# Patient Record
Sex: Female | Born: 1990 | Race: Black or African American | Hispanic: No | Marital: Single | Smoking: Never smoker
Health system: Southern US, Community
[De-identification: ages and names within clinical notes are randomized; demographics above are authoritative.]

---
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
## 2010-01-04 ENCOUNTER — Inpatient Hospital Stay (HOSPITAL_COMMUNITY): Admission: AD | Admit: 2010-01-04 | Discharge: 2010-01-04 | Payer: Self-pay | Admitting: Obstetrics and Gynecology

## 2010-01-08 ENCOUNTER — Inpatient Hospital Stay (HOSPITAL_COMMUNITY): Admission: AD | Admit: 2010-01-08 | Discharge: 2010-01-11 | Payer: Self-pay | Admitting: Obstetrics and Gynecology

## 2010-11-24 IMAGING — US US OB DETAIL+14 WK
1 series · 14 of 28 positions shown · non-contrast
Comparison: none

OBSTETRICAL ULTRASOUND:
 This ultrasound exam was performed in the [HOSPITAL] Ultrasound Department.  The OB US report was generated in the AS system, and faxed to the ordering physician.  This report is also available in [HOSPITAL]?s Access[employer] and in [REDACTED] PACS.

[Series 1: us ob detail +14 wk · 0.24mm/px · 14 of 77 slices shown]
[im 3/77]
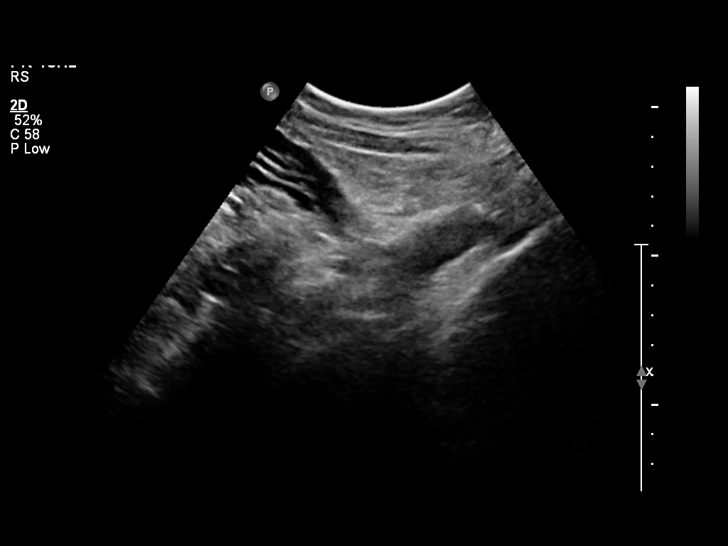
[im 9/77]
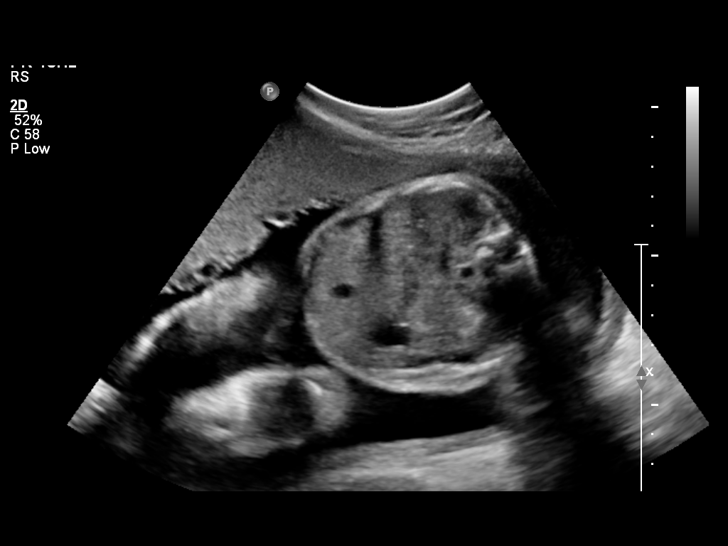
[im 15/77]
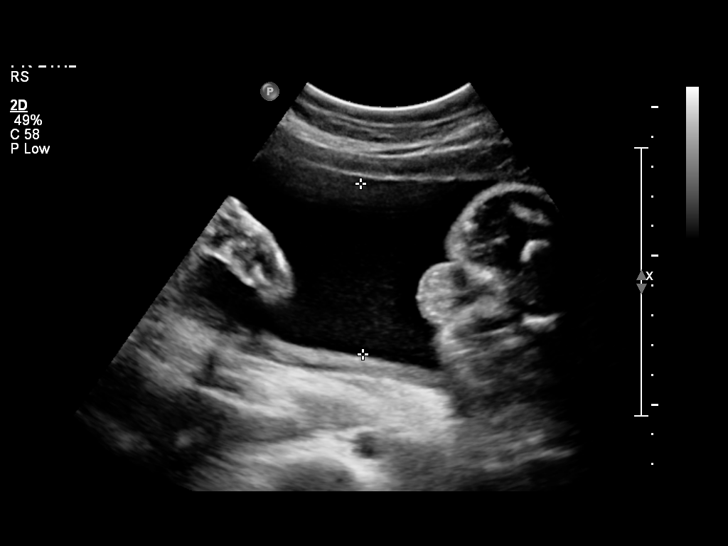
[im 20/77]
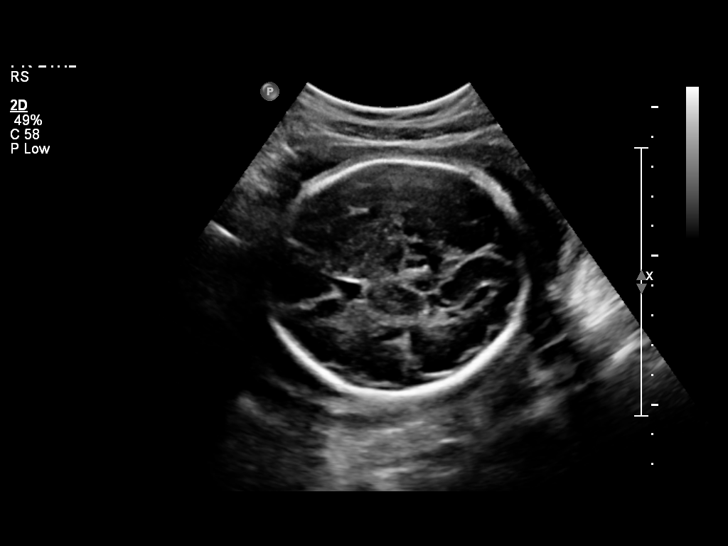
[im 26/77]
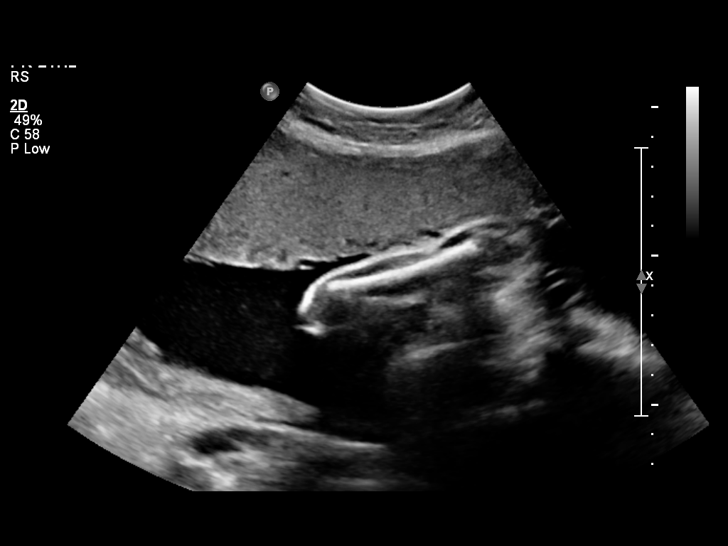
[im 31/77]
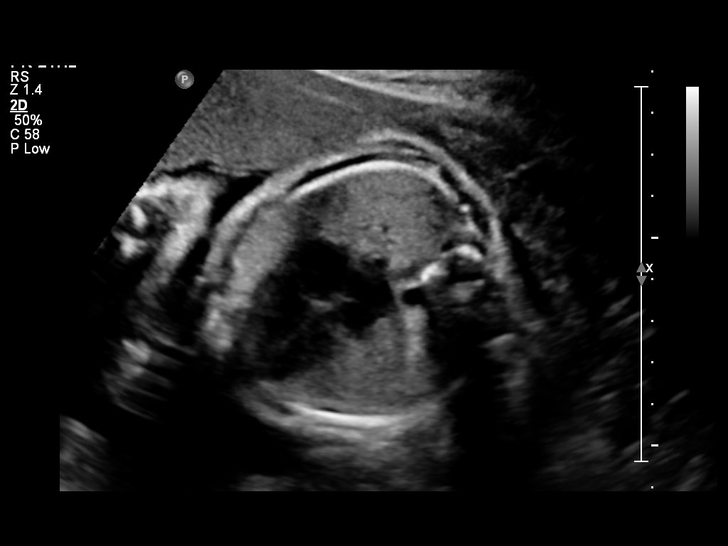
[im 37/77]
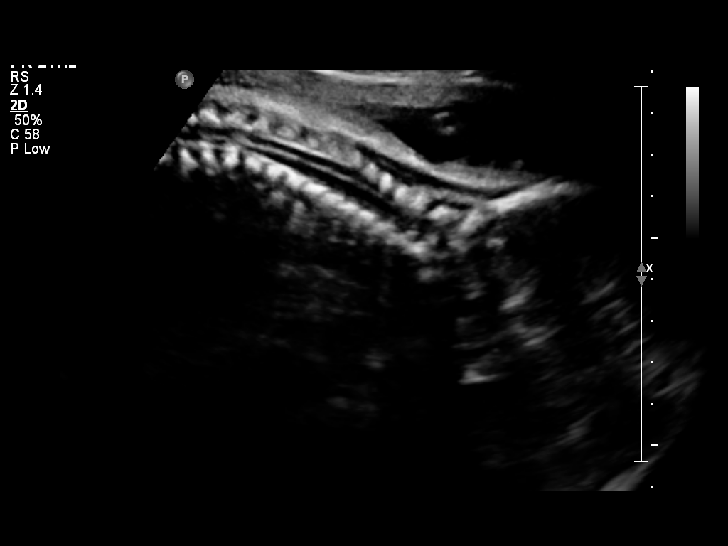
[im 43/77]
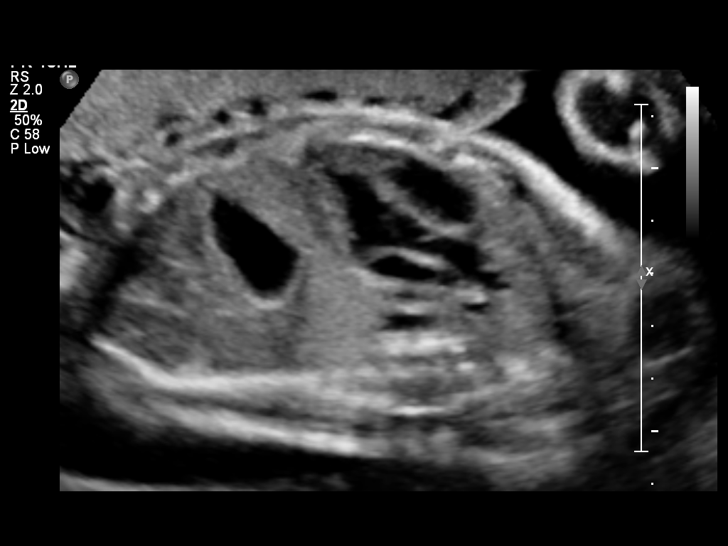
[im 48/77]
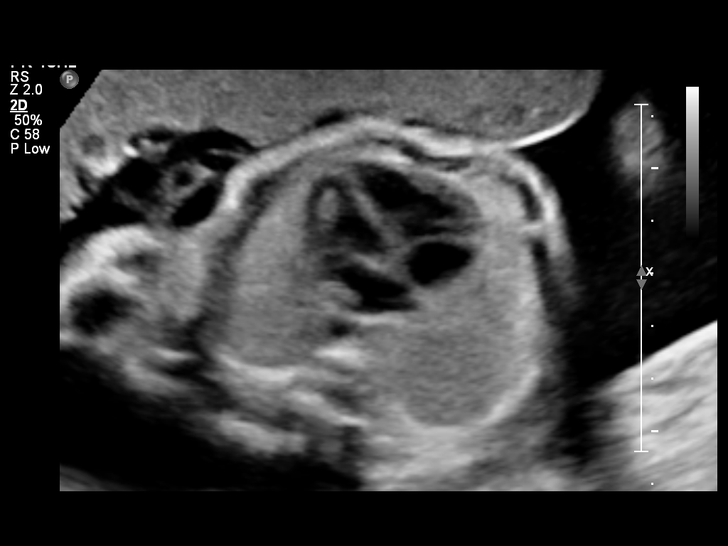
[im 54/77]
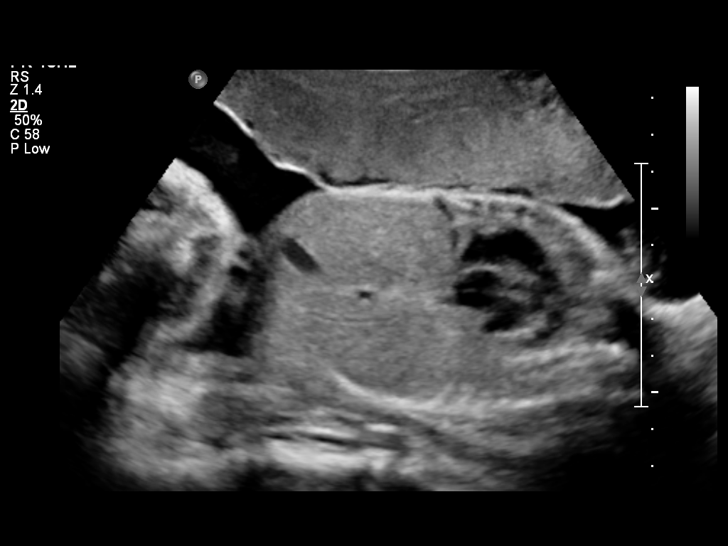
[im 60/77]
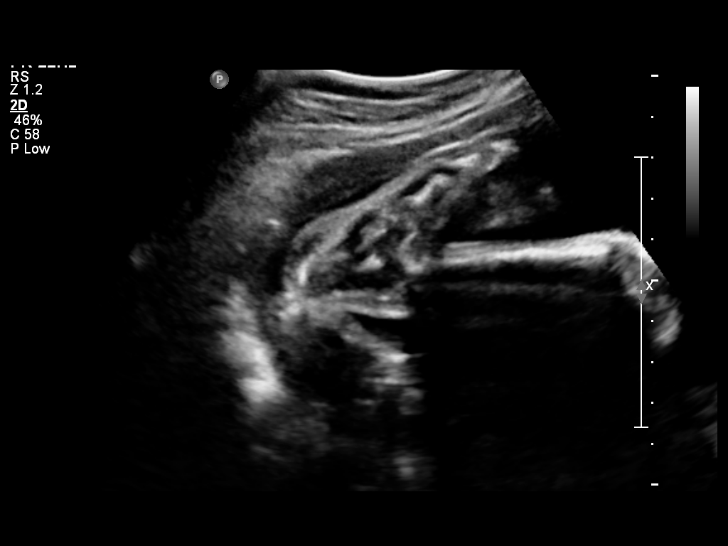
[im 65/77]
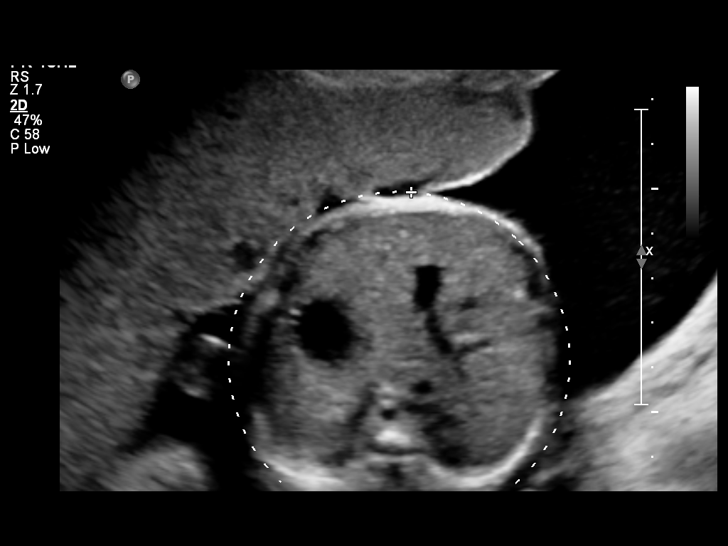
[im 71/77]
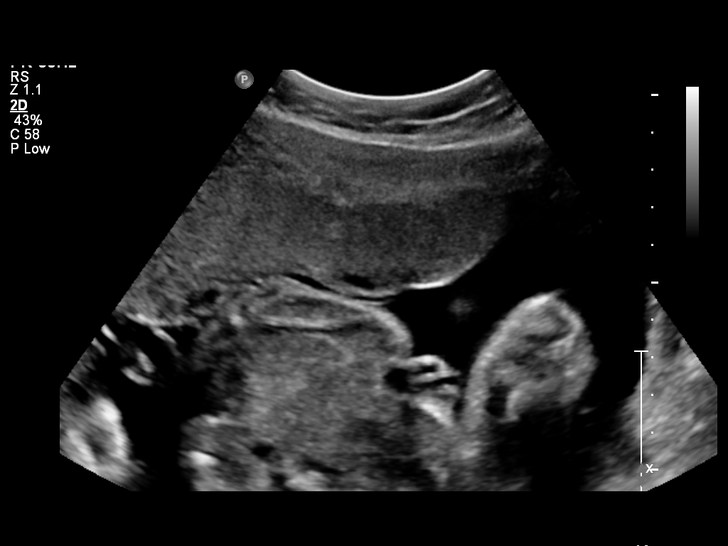
[im 77/77]
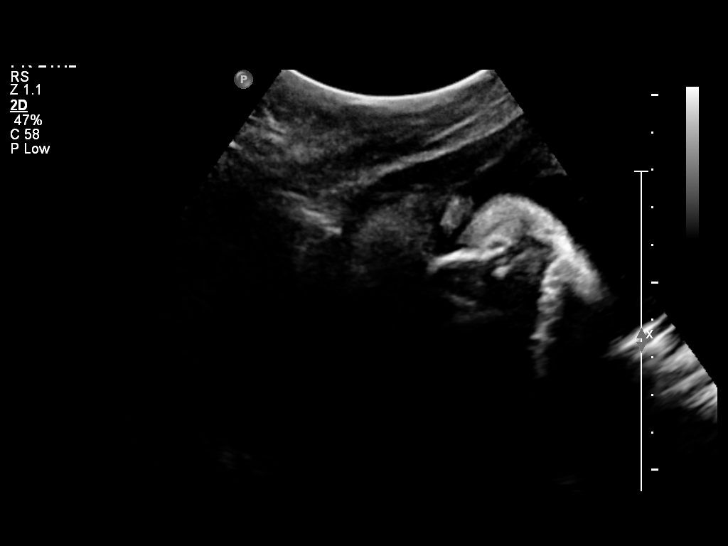

[14 of 28 positions shown; findings below may reference images not displayed]

IMPRESSION: See AS Obstetric US report.

## 2011-02-15 LAB — CBC
HCT: 32.3 % — ABNORMAL LOW (ref 36.0–46.0)
HCT: 36.2 % (ref 36.0–46.0)
Hemoglobin: 10.5 g/dL — ABNORMAL LOW (ref 12.0–15.0)
Hemoglobin: 11.7 g/dL — ABNORMAL LOW (ref 12.0–15.0)
MCHC: 32.3 g/dL (ref 30.0–36.0)
MCHC: 32.4 g/dL (ref 30.0–36.0)
MCV: 81.3 fL (ref 78.0–100.0)
MCV: 82.2 fL (ref 78.0–100.0)
Platelets: 152 10*3/uL (ref 150–400)
Platelets: 188 10*3/uL (ref 150–400)
RBC: 3.93 MIL/uL (ref 3.87–5.11)
RBC: 4.46 MIL/uL (ref 3.87–5.11)
RDW: 17.4 % — ABNORMAL HIGH (ref 11.5–15.5)
RDW: 17.6 % — ABNORMAL HIGH (ref 11.5–15.5)
WBC: 5.8 10*3/uL (ref 4.0–10.5)
WBC: 7.8 10*3/uL (ref 4.0–10.5)

## 2011-02-15 LAB — RPR: RPR Ser Ql: NONREACTIVE

## 2015-08-17 ENCOUNTER — Encounter (HOSPITAL_BASED_OUTPATIENT_CLINIC_OR_DEPARTMENT_OTHER): Payer: Self-pay

## 2015-08-17 ENCOUNTER — Emergency Department (HOSPITAL_BASED_OUTPATIENT_CLINIC_OR_DEPARTMENT_OTHER)
Admission: EM | Admit: 2015-08-17 | Discharge: 2015-08-17 | Disposition: A | Discharge status: Home / Self Care | Payer: Medicaid Other | Attending: Emergency Medicine | Admitting: Emergency Medicine

## 2015-08-17 DIAGNOSIS — Y9389 Activity, other specified: Secondary | ICD-10-CM | POA: Diagnosis not present

## 2015-08-17 DIAGNOSIS — Y9289 Other specified places as the place of occurrence of the external cause: Secondary | ICD-10-CM | POA: Diagnosis not present

## 2015-08-17 DIAGNOSIS — A6920 Lyme disease, unspecified: Secondary | ICD-10-CM | POA: Diagnosis not present

## 2015-08-17 DIAGNOSIS — S70362A Insect bite (nonvenomous), left thigh, initial encounter: Secondary | ICD-10-CM | POA: Diagnosis present

## 2015-08-17 DIAGNOSIS — Y998 Other external cause status: Secondary | ICD-10-CM | POA: Insufficient documentation

## 2015-08-17 DIAGNOSIS — W57XXXA Bitten or stung by nonvenomous insect and other nonvenomous arthropods, initial encounter: Secondary | ICD-10-CM | POA: Insufficient documentation

## 2015-08-17 DIAGNOSIS — L03116 Cellulitis of left lower limb: Secondary | ICD-10-CM | POA: Diagnosis not present

## 2015-08-17 MED ORDER — CEFTRIAXONE SODIUM 1 G IJ SOLR
1.0000 g | Freq: Once | INTRAMUSCULAR | Status: AC
  Administered 2015-08-17: 1 g via INTRAMUSCULAR
  Filled 2015-08-17: qty 10

## 2015-08-17 MED ORDER — DOXYCYCLINE HYCLATE 100 MG PO CAPS: 100.0000 mg | ORAL_CAPSULE | Freq: Two times a day (BID) | ORAL | Status: AC

## 2015-08-17 MED ORDER — LIDOCAINE HCL (PF) 1 % IJ SOLN
INTRAMUSCULAR | Status: AC
  Administered 2015-08-17: 2.1 mL
  Filled 2015-08-17: qty 5

## 2015-08-17 NOTE — Discharge Instructions (Signed)
Cellulitis Cellulitis is an infection of the skin and the tissue under the skin. The infected area is usually red and tender. This happens most often in the arms and lower legs. HOME CARE   Take your antibiotic medicine as told. Finish the medicine even if you start to feel better.  Keep the infected arm or leg raised (elevated).  Put a warm cloth on the area up to 4 times per day.  Only take medicines as told by your doctor.  Keep all doctor visits as told. GET HELP IF:  You see red streaks on the skin coming from the infected area.  Your red area gets bigger or turns a dark color.  Your bone or joint under the infected area is painful after the skin heals.  Your infection comes back in the same area or different area.  You have a puffy (swollen) bump in the infected area.  You have new symptoms.  You have a fever. GET HELP RIGHT AWAY IF:   You feel very sleepy.  You throw up (vomit) or have watery poop (diarrhea).  You feel sick and have muscle aches and pains. MAKE SURE YOU:   Understand these instructions.  Will watch your condition.  Will get help right away if you are not doing well or get worse. Document Released: 04/30/2008 Document Revised: 03/29/2014 Document Reviewed: 01/28/2012 [facility] Patient Information 2015 [place], [place]. This information is not intended to replace advice given to you by your health care provider. Make sure you discuss any questions you have with your health care provider.  Lyme Disease You may have been bitten by a tick and are to watch for the development of Lyme Disease. Lyme Disease is an infection that is caused by a bacteria The bacteria causing this disease is named Borreilia burgdorferi. If a tick is infected with this bacteria and then bites you, then Lyme Disease may occur. These ticks are carried by deer and rodents such as rabbits and mice and infest grassy as well as forested areas. Fortunately most tick bites do not cause  Lyme Disease.  Lyme Disease is easier to prevent than to treat. First, covering your legs with clothing when walking in areas where ticks are possibly abundant will prevent their attachment because ticks tend to stay within inches of the ground. Second, using insecticides containing DEET can be applied on skin or clothing. Last, because it takes about 12 to 24 hours for the tick to transmit the disease after attachment to the human host, you should inspect your body for ticks twice a day when you are in areas where Lyme Disease is common. You must look thoroughly when searching for ticks. The Ixodes tick that carries Lyme Disease is very small. It is around the size of a sesame seed (picture of tick is not actual size). Removal is best done by grasping the tick by the head and pulling it out. Do not to squeeze the body of the tick. This could inject the infecting bacteria into the bite site. Wash the area of the bite with an antiseptic solution after removal.  Lyme Disease is a disease that may affect many body systems. Because of the small size of the biting tick, most people do not notice being bitten. The first sign of an infection is usually a round red rash that extends out from the center of the tick bite. The center of the lesion may be blood colored (hemorrhagic) or have tiny blisters (vesicular). Most lesions have bright  red outer borders and partial central clearing. This rash may extend out many inches in diameter, and multiple lesions may be present. Other symptoms such as fatigue, headaches, chills and fever, general achiness and swelling of lymph glands may also occur. If this first stage of the disease is left untreated, these symptoms may gradually resolve by themselves, or progressive symptoms may occur because of spread of infection to other areas of the body.  Follow up with your caregiver to have testing and treatment if you have a tick bite and you develop any of the above complaints. Your  caregiver may recommend preventative (prophylactic) medications which kill bacteria (antibiotics). Once a diagnosis of Lyme Disease is made, antibiotic treatment is highly likely to cure the disease. Effective treatment of late stage Lyme Disease may require longer courses of antibiotic therapy.  MAKE SURE YOU:   Understand these instructions.  Will watch your condition.  Will get help right away if you are not doing well or get worse. Document Released: 02/18/2001 Document Revised: 02/04/2012 Document Reviewed: 04/22/2009 [facility] Patient Information 2015 [place], [place]. This information is not intended to replace advice given to you by your health care provider. Make sure you discuss any questions you have with your health care provider.

## 2015-08-17 NOTE — ED Provider Notes (Signed)
CSN: [id number]     Arrival date & time 08/17/15  1944 History  This chart was scribed for Rolland Porter, MD by Chestine Spore, ED Scribe. The patient was seen in room MH04/MH04 at 8:03 PM.     Chief Complaint  Patient presents with  . Insect Bite     The history is provided by the patient. No language interpreter was used.    Sierra Taylor is a 24 y.o. female who presents to the Emergency Department complaining of an worsening insect bite onset 4 days. She states that she has a spider bite to her left upper thigh that is red and itchy without pain. She states that the area began small and has been increasing in size x 4 days. She denies having these symptoms before in the past. Pt is having associated symptoms of redness and warmth. Pt denies  rash, swelling, fever, myalgia, and any other symptoms.   History reviewed. No pertinent past medical history. History reviewed. No pertinent past surgical history. No family history on file. Social History  Substance Use Topics  . Smoking status: None  . Smokeless tobacco: None  . Alcohol Use: None   OB History    No data available     Review of Systems  Constitutional: Negative for fever, chills, diaphoresis, appetite change and fatigue.  HENT: Negative for mouth sores, sore throat and trouble swallowing.   Eyes: Negative for visual disturbance.  Respiratory: Negative for cough, chest tightness, shortness of breath and wheezing.   Cardiovascular: Negative for chest pain.  Gastrointestinal: Negative for nausea, vomiting, abdominal pain, diarrhea and abdominal distention.  Endocrine: Negative for polydipsia, polyphagia and polyuria.  Genitourinary: Negative for dysuria, frequency and hematuria.  Musculoskeletal: Negative for myalgias and gait problem.  Skin: Positive for color change (area of redness to the left upper thigh). Negative for pallor and rash.  Neurological: Negative for dizziness, syncope, light-headedness and headaches.   Hematological: Does not bruise/bleed easily.  Psychiatric/Behavioral: Negative for behavioral problems and confusion.    Allergies  Review of patient's allergies indicates no known allergies.  Home Medications   Prior to Admission medications   Medication Sig Start Date End Date Taking? Authorizing Provider  doxycycline (VIBRAMYCIN) 100 MG capsule Take 1 capsule (100 mg total) by mouth 2 (two) times daily. 08/17/15   Rolland Porter, MD   BP 115/66 mmHg  Pulse 88  Temp(Src) 98.5 F (36.9 C) (Oral)  Resp 16  Ht  (1.575 m)  Wt 157 lb (71.215 kg)  BMI 28.71 kg/m2  SpO2 100% Physical Exam  Constitutional: She is oriented to person, place, and time. She appears well-developed and well-nourished. No distress.  HENT:  Head: Normocephalic.  Eyes: Conjunctivae are normal. Pupils are equal, round, and reactive to light. No scleral icterus.  Neck: Normal range of motion. Neck supple. No thyromegaly present.  Cardiovascular: Normal rate, regular rhythm and normal heart sounds.  Exam reveals no gallop and no friction rub.   No murmur heard. Pulmonary/Chest: Effort normal and breath sounds normal. No respiratory distress. She has no wheezes. She has no rales.  Abdominal: Soft. Bowel sounds are normal. She exhibits no distension. There is no tenderness. There is no rebound.  Musculoskeletal: Normal range of motion.  Neurological: She is alert and oriented to person, place, and time.  Skin: Skin is warm and dry. No rash noted.     Psychiatric: She has a normal mood and affect. Her behavior is normal.  Nursing note and vitals  reviewed.   ED Course  Procedures (including critical care time) DIAGNOSTIC STUDIES: Oxygen Saturation is 100% on RA, nl by my interpretation.    COORDINATION OF CARE: 8:13 PM Discussed treatment plan with pt at bedside and pt agreed to plan.    Labs Review Labs Reviewed - No data to display  Imaging Review No results found. I have personally reviewed and  evaluated these images and lab results as part of my medical decision-making.   EKG Interpretation None      MDM   Final diagnoses:  Cellulitis of left lower extremity  Erythema chronicum migrans    IM rocephin 14 days doxy.   Medical screening examination/treatment/procedure(s) were performed by non-physician practitioner and as supervising physician I was immediately available for consultation/collaboration.   EKG Interpretation None         Rolland Porter, MD 08/17/15 2030

## 2015-08-17 NOTE — ED Notes (Signed)
Pt states she has a spider bite on her upper left thigh that is red and itchy and getting larger with a head to it.  Denies pain, states it has been there since Sunday.

## 2016-05-01 ENCOUNTER — Encounter (HOSPITAL_BASED_OUTPATIENT_CLINIC_OR_DEPARTMENT_OTHER): Payer: Self-pay | Admitting: *Deleted

## 2016-05-01 ENCOUNTER — Emergency Department (HOSPITAL_BASED_OUTPATIENT_CLINIC_OR_DEPARTMENT_OTHER)
Admission: EM | Admit: 2016-05-01 | Discharge: 2016-05-01 | Disposition: A | Discharge status: Home / Self Care | Payer: Medicaid Other | Attending: Emergency Medicine | Admitting: Emergency Medicine

## 2016-05-01 DIAGNOSIS — N760 Acute vaginitis: Secondary | ICD-10-CM | POA: Insufficient documentation

## 2016-05-01 DIAGNOSIS — N390 Urinary tract infection, site not specified: Secondary | ICD-10-CM | POA: Diagnosis not present

## 2016-05-01 DIAGNOSIS — R109 Unspecified abdominal pain: Secondary | ICD-10-CM | POA: Diagnosis present

## 2016-05-01 DIAGNOSIS — B9689 Other specified bacterial agents as the cause of diseases classified elsewhere: Secondary | ICD-10-CM

## 2016-05-01 LAB — URINE MICROSCOPIC-ADD ON: RBC / HPF: NONE SEEN RBC/hpf (ref 0–5)

## 2016-05-01 LAB — URINALYSIS, ROUTINE W REFLEX MICROSCOPIC
Bilirubin Urine: NEGATIVE
Glucose, UA: NEGATIVE mg/dL
Hgb urine dipstick: NEGATIVE
Ketones, ur: NEGATIVE mg/dL
Nitrite: POSITIVE — AB
Protein, ur: NEGATIVE mg/dL
Specific Gravity, Urine: 1.022 (ref 1.005–1.030)
pH: 6 (ref 5.0–8.0)

## 2016-05-01 LAB — WET PREP, GENITAL
Sperm: NONE SEEN
Trich, Wet Prep: NONE SEEN
Yeast Wet Prep HPF POC: NONE SEEN

## 2016-05-01 LAB — PREGNANCY, URINE: Preg Test, Ur: NEGATIVE

## 2016-05-01 MED ORDER — CEPHALEXIN 500 MG PO CAPS: 1000.0000 mg | ORAL_CAPSULE | Freq: Two times a day (BID) | ORAL | Status: DC

## 2016-05-01 MED ORDER — METRONIDAZOLE 500 MG PO TABS: 500.0000 mg | ORAL_TABLET | Freq: Two times a day (BID) | ORAL | Status: AC

## 2016-05-01 NOTE — ED Notes (Signed)
Abdominal cramps. States she feels like she had a UTI. She started Nuvaring June 1st.

## 2016-05-01 NOTE — ED Notes (Addendum)
Went in to see Pt. And assess.... Asked Pt. To put a gown on ... Will go back in to see Pt. After she is in a gown

## 2016-05-01 NOTE — ED Provider Notes (Signed)
CSN: [id number]     Arrival date & time 05/01/16  1302 History   First MD Initiated Contact with Patient 05/01/16 1308     Chief Complaint  Patient presents with  . Abdominal Pain     (Consider location/radiation/quality/duration/timing/severity/associated sxs/prior Treatment) HPI   25 year old female presenting for evaluation of abdominal discomfort. Patient report for the past week she has noticed increased urinary frequency with foul odor in her urine and a urine is darker in color. There is no associated fever, chills, abdominal pain, back pain, burning on urination, urgency, vaginal bleeding, hematuria, or vaginal discharge. She was using IUD in the past but recently switch to NuvaRing 6 days ago and was wondering if that may have caused the symptom. She had a remote history of chlamydia infection. She has been sexually active with 3 separate sexual partners within the past 6 months. She denies any pain with sexual activities. She is unable to recall her last menstrual. Due to being on birth control.    History reviewed. No pertinent past medical history. History reviewed. No pertinent past surgical history. No family history on file. Social History  Substance Use Topics  . Smoking status: Never Smoker   . Smokeless tobacco: None  . Alcohol Use: Yes   OB History    No data available     Review of Systems  All other systems reviewed and are negative.     Allergies  Review of patient's allergies indicates no known allergies.  Home Medications   Prior to Admission medications   Medication Sig Start Date End Date Taking? Authorizing Provider  doxycycline (VIBRAMYCIN) 100 MG capsule Take 1 capsule (100 mg total) by mouth 2 (two) times daily. 08/17/15   Rolland PorterMark James, MD   BP 112/72 mmHg  Pulse 84  Temp(Src) 98.8 F (37.1 C) (Oral)  Resp 20  Ht 5\' 2"  (1.575 m)  Wt 73.483 kg  BMI 29.62 kg/m2  SpO2 100% Physical Exam  Constitutional: She appears well-developed and  well-nourished. No distress.  HENT:  Head: Atraumatic.  Eyes: Conjunctivae are normal.  Neck: Neck supple.  Cardiovascular: Normal rate, regular rhythm and normal heart sounds.   Pulmonary/Chest: Effort normal and breath sounds normal.  Abdominal: Soft. Bowel sounds are normal. She exhibits no distension. There is no tenderness.  Genitourinary:  Chaperone present during exam. No inguinal lymphadenopathy or inguinal hernia noted. Normal external genitalia. Vaginal vault with moderate amount of white vaginal discharge. Nuvaring is visualized and appears to be in place. Closed cervical os was visualized free of lesion or rash. On bimanual examination, no adnexal tenderness or cervical motion tenderness. No mass.  Neurological: She is alert.  Skin: No rash noted.  Psychiatric: She has a normal mood and affect.  Nursing note and vitals reviewed.   ED Course  Procedures (including critical care time) Labs Review Labs Reviewed  WET PREP, GENITAL - Abnormal; Notable for the following:    Clue Cells Wet Prep HPF POC PRESENT (*)    WBC, Wet Prep HPF POC MANY (*)    All other components within normal limits  URINALYSIS, ROUTINE W REFLEX MICROSCOPIC (NOT AT [facility]) - Abnormal; Notable for the following:    APPearance CLOUDY (*)    Nitrite POSITIVE (*)    Leukocytes, UA SMALL (*)    All other components within normal limits  URINE MICROSCOPIC-ADD ON - Abnormal; Notable for the following:    Squamous Epithelial / LPF 6-30 (*)    Bacteria, UA MANY (*)  All other components within normal limits  PREGNANCY, URINE  GC/CHLAMYDIA PROBE AMP ([place]) NOT AT [facility]    Imaging Review No results found. I have personally reviewed and evaluated these images and lab results as part of my medical decision-making.   EKG Interpretation None      MDM   Final diagnoses:  UTI (lower urinary tract infection)  BV (bacterial vaginosis)    BP 112/72 mmHg  Pulse 84  Temp(Src) 98.8 F (37.1 C)  (Oral)  Resp 20  Ht  (1.575 m)  Wt 73.483 kg  BMI 29.62 kg/m2  SpO2 100%   1:47 PM Patient here with complaints of urine odor and also concern of her NuvaRing.Her urine shows signs of urinary tract infection. Her NuvaRing is in the right location. No significant pelvic discomfort to suggest PID.  2:47 PM Evidence of BV noted in wet prep.  Will treat with flagyl.  Pt admits she uses water and vinegar to wash her vagina which i recommend against.  She no longer douche.  Encourage taking abx with food and to eat yogurt or try probiotic to decrease risk of abx induced diarrhea.  Return precaution discussed.    Fayrene Helper, PA-C 05/01/16 1449  Lavera Guise, MD 05/01/16 [phone]

## 2016-05-01 NOTE — Discharge Instructions (Signed)
Bacterial Vaginosis °Bacterial vaginosis is an infection of the vagina. It happens when too many germs (bacteria) grow in the vagina. Having this infection puts you at risk for getting other infections from sex. Treating this infection can help lower your risk for other infections, such as:  °· Chlamydia. °· Gonorrhea. °· HIV. °· Herpes. °HOME CARE °· Take your medicine as told by your doctor. °· Finish your medicine even if you start to feel better. °· Tell your sex partner that you have an infection. They should see their doctor for treatment. °· During treatment: °· Avoid sex or use condoms correctly. °· Do not douche. °· Do not drink alcohol unless your doctor tells you it is ok. °· Do not breastfeed unless your doctor tells you it is ok. °GET HELP IF: °· You are not getting better after 3 days of treatment. °· You have more grey fluid (discharge) coming from your vagina than before. °· You have more pain than before. °· You have a fever. °MAKE SURE YOU:  °· Understand these instructions. °· Will watch your condition. °· Will get help right away if you are not doing well or get worse. °  °This information is not intended to replace advice given to you by your health care provider. Make sure you discuss any questions you have with your health care provider. °  °Document Released: 08/21/2008 Document Revised: 12/03/2014 Document Reviewed: 06/24/2013 °Elsevier Interactive Patient Education ©2016 [employer] °Urinary Tract Infection °A urinary tract infection (UTI) can occur any place along the urinary tract. The tract includes the kidneys, ureters, bladder, and urethra. A type of germ called bacteria often causes a UTI. UTIs are often helped with antibiotic medicine.  °HOME CARE  °· If given, take antibiotics as told by your doctor. Finish them even if you start to feel better. °· Drink enough fluids to keep your pee (urine) clear or pale yellow. °· Avoid tea, drinks with caffeine, and bubbly (carbonated)  drinks. °· Pee often. Avoid holding your pee in for a long time. °· Pee before and after having sex (intercourse). °· Wipe from front to back after you poop (bowel movement) if you are a woman. Use each tissue only once. °GET HELP RIGHT AWAY IF:  °· You have back pain. °· You have lower belly (abdominal) pain. °· You have chills. °· You feel sick to your stomach (nauseous). °· You throw up (vomit). °· Your burning or discomfort with peeing does not go away. °· You have a fever. °· Your symptoms are not better in 3 days. °MAKE SURE YOU:  °· Understand these instructions. °· Will watch your condition. °· Will get help right away if you are not doing well or get worse. °  °This information is not intended to replace advice given to you by your health care provider. Make sure you discuss any questions you have with your health care provider. °  °Document Released: 04/30/2008 Document Revised: 12/03/2014 Document Reviewed: 06/12/2012 °Elsevier Interactive Patient Education ©2016 [employer] ° °

## 2016-05-02 LAB — GC/CHLAMYDIA PROBE AMP (~~LOC~~) NOT AT ARMC
Chlamydia: NEGATIVE
Neisseria Gonorrhea: NEGATIVE

## 2020-10-30 ENCOUNTER — Other Ambulatory Visit: Payer: Self-pay

## 2020-10-30 ENCOUNTER — Emergency Department (HOSPITAL_BASED_OUTPATIENT_CLINIC_OR_DEPARTMENT_OTHER)
Admission: EM | Admit: 2020-10-30 | Discharge: 2020-10-30 | Disposition: A | Discharge status: Home / Self Care | Payer: Medicaid Other | Attending: Emergency Medicine | Admitting: Emergency Medicine

## 2020-10-30 ENCOUNTER — Encounter (HOSPITAL_BASED_OUTPATIENT_CLINIC_OR_DEPARTMENT_OTHER): Payer: Self-pay | Admitting: Emergency Medicine

## 2020-10-30 ENCOUNTER — Emergency Department (HOSPITAL_BASED_OUTPATIENT_CLINIC_OR_DEPARTMENT_OTHER): Payer: Medicaid Other

## 2020-10-30 DIAGNOSIS — O26892 Other specified pregnancy related conditions, second trimester: Secondary | ICD-10-CM | POA: Diagnosis present

## 2020-10-30 DIAGNOSIS — O2342 Unspecified infection of urinary tract in pregnancy, second trimester: Secondary | ICD-10-CM | POA: Diagnosis not present

## 2020-10-30 DIAGNOSIS — Z3A16 16 weeks gestation of pregnancy: Secondary | ICD-10-CM | POA: Insufficient documentation

## 2020-10-30 DIAGNOSIS — Z331 Pregnant state, incidental: Secondary | ICD-10-CM

## 2020-10-30 LAB — URINALYSIS, ROUTINE W REFLEX MICROSCOPIC
Bilirubin Urine: NEGATIVE
Glucose, UA: NEGATIVE mg/dL
Hgb urine dipstick: NEGATIVE
Ketones, ur: NEGATIVE mg/dL
Nitrite: POSITIVE — AB
Protein, ur: NEGATIVE mg/dL
Specific Gravity, Urine: 1.025 (ref 1.005–1.030)
pH: 6.5 (ref 5.0–8.0)

## 2020-10-30 LAB — CBC WITH DIFFERENTIAL/PLATELET
Abs Immature Granulocytes: 0.08 10*3/uL — ABNORMAL HIGH (ref 0.00–0.07)
Basophils Absolute: 0 10*3/uL (ref 0.0–0.1)
Basophils Relative: 1 %
Eosinophils Absolute: 0.1 10*3/uL (ref 0.0–0.5)
Eosinophils Relative: 2 %
HCT: 35.9 % — ABNORMAL LOW (ref 36.0–46.0)
Hemoglobin: 11.4 g/dL — ABNORMAL LOW (ref 12.0–15.0)
Immature Granulocytes: 1 %
Lymphocytes Relative: 24 %
Lymphs Abs: 1.5 10*3/uL (ref 0.7–4.0)
MCH: 24.9 pg — ABNORMAL LOW (ref 26.0–34.0)
MCHC: 31.8 g/dL (ref 30.0–36.0)
MCV: 78.6 fL — ABNORMAL LOW (ref 80.0–100.0)
Monocytes Absolute: 0.5 10*3/uL (ref 0.1–1.0)
Monocytes Relative: 8 %
Neutro Abs: 4 10*3/uL (ref 1.7–7.7)
Neutrophils Relative %: 64 %
Platelets: 179 10*3/uL (ref 150–400)
RBC: 4.57 MIL/uL (ref 3.87–5.11)
RDW: 15.8 % — ABNORMAL HIGH (ref 11.5–15.5)
WBC: 6.3 10*3/uL (ref 4.0–10.5)
nRBC: 0 % (ref 0.0–0.2)

## 2020-10-30 LAB — COMPREHENSIVE METABOLIC PANEL
ALT: 15 U/L (ref 0–44)
AST: 19 U/L (ref 15–41)
Albumin: 3.3 g/dL — ABNORMAL LOW (ref 3.5–5.0)
Alkaline Phosphatase: 59 U/L (ref 38–126)
Anion gap: 6 (ref 5–15)
BUN: 9 mg/dL (ref 6–20)
CO2: 26 mmol/L (ref 22–32)
Calcium: 9 mg/dL (ref 8.9–10.3)
Chloride: 102 mmol/L (ref 98–111)
Creatinine, Ser: 0.67 mg/dL (ref 0.44–1.00)
GFR, Estimated: >60 mL/min (ref >60)
Glucose, Bld: 93 mg/dL (ref 70–99)
Potassium: 3.7 mmol/L (ref 3.5–5.1)
Sodium: 134 mmol/L — ABNORMAL LOW (ref 135–145)
Total Bilirubin: 0.2 mg/dL — ABNORMAL LOW (ref 0.3–1.2)
Total Protein: 7 g/dL (ref 6.5–8.1)

## 2020-10-30 LAB — ACETAMINOPHEN LEVEL: Acetaminophen (Tylenol), Serum: <10 ug/mL — ABNORMAL LOW (ref 10–30)

## 2020-10-30 LAB — URINALYSIS, MICROSCOPIC (REFLEX)

## 2020-10-30 MED ORDER — CEPHALEXIN 500 MG PO CAPS: 500.0000 mg | ORAL_CAPSULE | Freq: Three times a day (TID) | ORAL | 0 refills | Status: AC

## 2020-10-30 NOTE — ED Provider Notes (Signed)
[facility] EMERGENCY DEPARTMENT Provider Note   CSN: [id number] Arrival date & time: 10/30/20  1932     History Chief Complaint  Patient presents with  . Shortness of Breath  . Fatigue    Sierra Taylor is a 29 y.o. female.  29 year old female presents with complaint of low back pain for at least the past month as well as fatigue and pain when having to hold her urine.  Patient reports last menstrual cycle July 08, 2020, has not had any prenatal care, G4 P3.  Denies vaginal bleeding or discharge, denies abdominal pain.  No other complaints or concerns.  Patient states that she recently moved here from [place] and has not arranged for OB care as of yet.        History reviewed. No pertinent past medical history.  There are no problems to display for this patient.   History reviewed. No pertinent surgical history.   OB History    Gravida  1   Para      Term      Preterm      AB      Living        SAB      TAB      Ectopic      Multiple      Live Births              No family history on file.  Social History   Tobacco Use  . Smoking status: Never Smoker  Substance Use Topics  . Alcohol use: Yes  . Drug use: No    Home Medications Prior to Admission medications   Medication Sig Start Date End Date Taking? Authorizing Provider  cephALEXin (KEFLEX) 500 MG capsule Take 1 capsule (500 mg total) by mouth 3 (three) times daily for 5 days. 10/30/20 11/04/20  Jeannie Fend, PA-C  doxycycline (VIBRAMYCIN) 100 MG capsule Take 1 capsule (100 mg total) by mouth 2 (two) times daily. 08/17/15   Rolland Porter, MD  metroNIDAZOLE (FLAGYL) 500 MG tablet Take 1 tablet (500 mg total) by mouth 2 (two) times daily. One po bid x 7 days 05/01/16   Fayrene Helper, PA-C    Allergies    Patient has no known allergies.  Review of Systems   Review of Systems  Constitutional: Positive for fatigue. Negative for chills and fever.  Gastrointestinal:  Negative for abdominal pain, constipation, diarrhea, nausea and vomiting.  Genitourinary: Negative for dysuria, vaginal bleeding and vaginal discharge.  Musculoskeletal: Positive for back pain.  Skin: Negative for rash and wound.  Allergic/Immunologic: Positive for immunocompromised state.  Neurological: Negative for weakness.  All other systems reviewed and are negative.   Physical Exam Updated Vital Signs BP 115/69 (BP Location: Right Arm)   Pulse 94   Temp 98.1 F (36.7 C) (Oral)   Resp 20   Ht 5\' 2"  (1.575 m)   Wt 89.4 kg   LMP 07/08/2020 (Approximate)   SpO2 99%   BMI 36.03 kg/m   Physical Exam Vitals and nursing note reviewed.  Constitutional:      General: She is not in acute distress.    Appearance: She is well-developed. She is not diaphoretic.  HENT:     Head: Normocephalic and atraumatic.  Cardiovascular:     Rate and Rhythm: Normal rate and regular rhythm.  Pulmonary:     Effort: Pulmonary effort is normal.     Breath sounds: Normal breath sounds. No decreased breath sounds.  Abdominal:     Palpations: Abdomen is soft.     Tenderness: There is no abdominal tenderness.  Skin:    General: Skin is warm and dry.     Findings: No ecchymosis or rash.  Neurological:     Mental Status: She is alert and oriented to person, place, and time.  Psychiatric:        Behavior: Behavior normal.     ED Results / Procedures / Treatments   Labs (all labs ordered are listed, but only abnormal results are displayed) Labs Reviewed  URINALYSIS, ROUTINE W REFLEX MICROSCOPIC - Abnormal; Notable for the following components:      Result Value   APPearance CLOUDY (*)    Nitrite POSITIVE (*)    Leukocytes,Ua TRACE (*)    All other components within normal limits  CBC WITH DIFFERENTIAL/PLATELET - Abnormal; Notable for the following components:   Hemoglobin 11.4 (*)    HCT 35.9 (*)    MCV 78.6 (*)    MCH 24.9 (*)    RDW 15.8 (*)    Abs Immature Granulocytes 0.08 (*)     All other components within normal limits  ACETAMINOPHEN LEVEL - Abnormal; Notable for the following components:   Acetaminophen (Tylenol), Serum <10 (*)    All other components within normal limits  COMPREHENSIVE METABOLIC PANEL - Abnormal; Notable for the following components:   Sodium 134 (*)    Albumin 3.3 (*)    Total Bilirubin 0.2 (*)    All other components within normal limits  URINALYSIS, MICROSCOPIC (REFLEX) - Abnormal; Notable for the following components:   Bacteria, UA MANY (*)    All other components within normal limits  URINE CULTURE    EKG None  Radiology US OB Limited  Result Date: 10/30/2020 CLINICAL DATA:  Back pain times.  No prenatal care. EXAM: LIMITED OBSTETRIC ULTRASOUND FINDINGS: Number of Fetuses: 1 Heart Rate:  152 bpm Movement: Yes Presentation: Breech Placental Location: Posterior Previa: No Amniotic Fluid (Subjective):  Within normal limits. BPD: 2.1 cm 16 w  2 d MATERNAL FINDINGS: Cervix:  Appears closed.  The cervical length is at least 3.4 cm. Uterus/Adnexae: The ovaries were not visualized on this study. IMPRESSION: Single live IUP at 16 weeks and 2 days. This exam is performed on an emergent basis and does not comprehensively evaluate fetal size, dating, or anatomy; follow-up complete OB US should be considered if further fetal assessment is warranted. Electronically Signed   By: Katherine Mantle M.D.   On: 10/30/2020 21:15    Procedures Procedures (including critical care time)  Medications Ordered in ED Medications - No data to display  ED Course  I have reviewed the triage vital signs and the nursing notes.  Pertinent labs & imaging results that were available during my care of the patient were reviewed by me and considered in my medical decision making (see chart for details).  Clinical Course as of Oct 30 2356  Sierra Taylor Oct 30, 2020  [id number] 29 year old female presents the emergency room with complaint of fatigue, low back pain, pain when trying  to hold her urine in the setting of pregnancy, potentially 16 weeks however has not had any prenatal care. On exam, there is no tenderness with patient of the back, no abdominal pain.  OB ultrasound ordered to confirm IUP, shows IUP with cardiac activity measuring 16 weeks and 2 days.  Concern for urinary tract infection with positive nitrates and trace leukocytes, urine will be sent for culture, patient  started on Keflex.  CBC and CMP without significant findings.  Patient referred to local OB clinic for further care.  Recommend that she start taking a prenatal vitamin.   [LM]    Clinical Course User Index [LM] Alden Hipp   MDM Rules/Calculators/A&P                          Final Clinical Impression(s) / ED Diagnoses Final diagnoses:  Urinary tract infection in mother during second trimester of pregnancy  Pregnant state, incidental    Rx / DC Orders ED Discharge Orders         Ordered    cephALEXin (KEFLEX) 500 MG capsule  3 times daily        10/30/20 2201           Jeannie Fend, PA-C 10/30/20 2357    Derwood Kaplan, MD 10/31/20 [phone]

## 2020-10-30 NOTE — ED Triage Notes (Signed)
Reports headache for a couple of days, fatigue for a few days and sob for a couple of weeks.  Also reports she is about [redacted] weeks pregnant but hasn't had any prenatal care.  Does endorse have low back pain for several weeks.

## 2020-10-30 NOTE — ED Notes (Signed)
Lab notified of add-on orders. Specimen already in lab.

## 2020-10-30 NOTE — Discharge Instructions (Addendum)
Take Keflex for urinary tract infection. Take a prenatal vitamin daily, these are available over the counter.  Follow up with OB for prenatal care.

## 2020-11-02 LAB — URINE CULTURE: Culture: >=100000 — AB

## 2021-11-28 IMAGING — US US OB LIMITED
1 series · 14 of 28 positions shown · non-contrast
Comparison: none

CLINICAL DATA: Back pain times.  No prenatal care.

EXAM:
LIMITED OBSTETRIC ULTRASOUND

[Series 1: us ob limited · 14 of 34 slices shown]
[im 2/34]
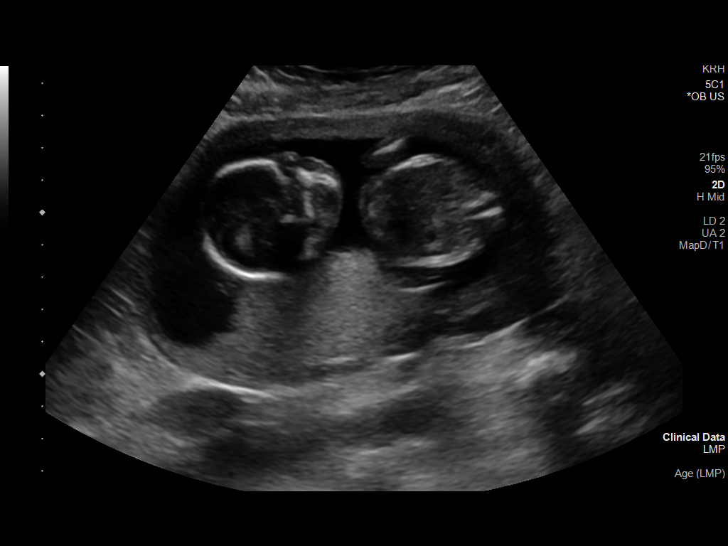
[im 4/34]
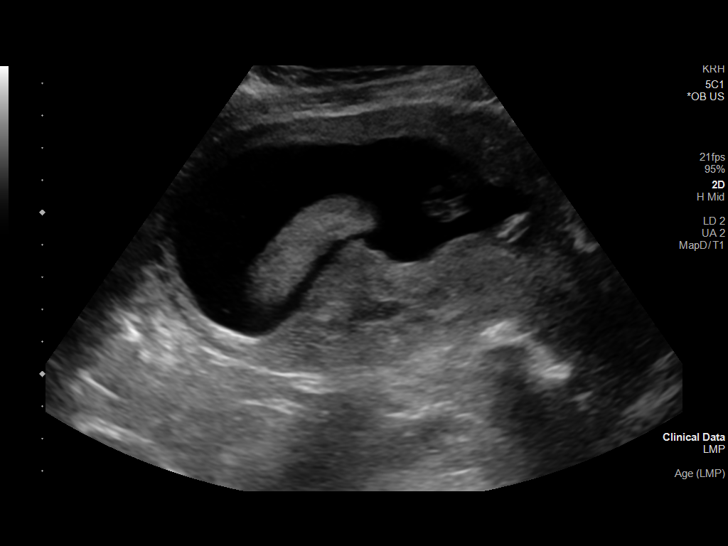
[im 7/34]
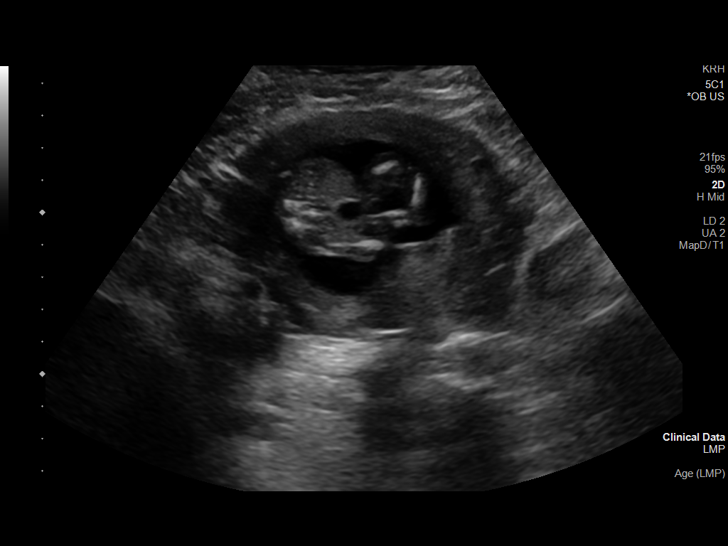
[im 9/34]
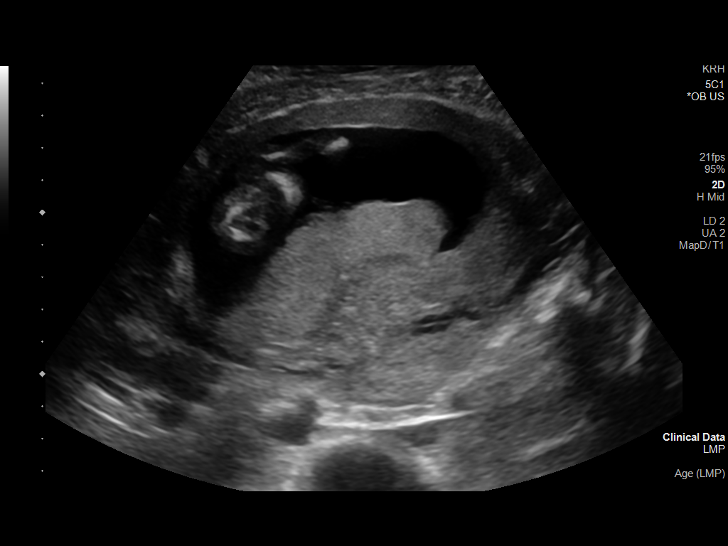
[im 12/34]
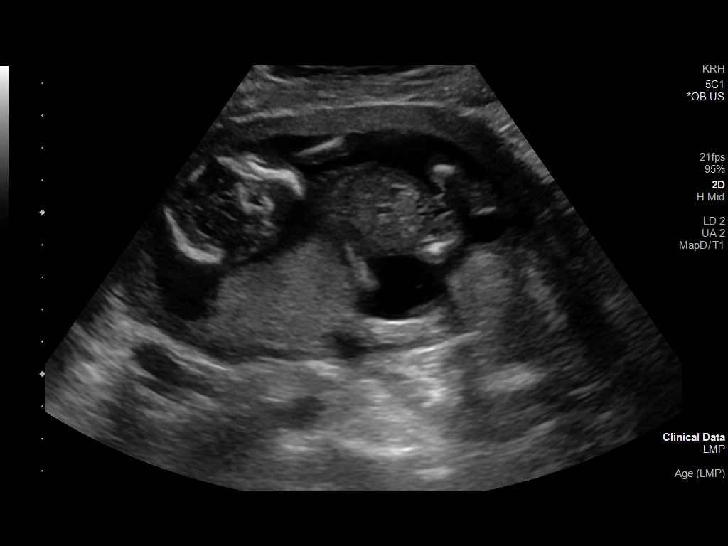
[im 14/34]
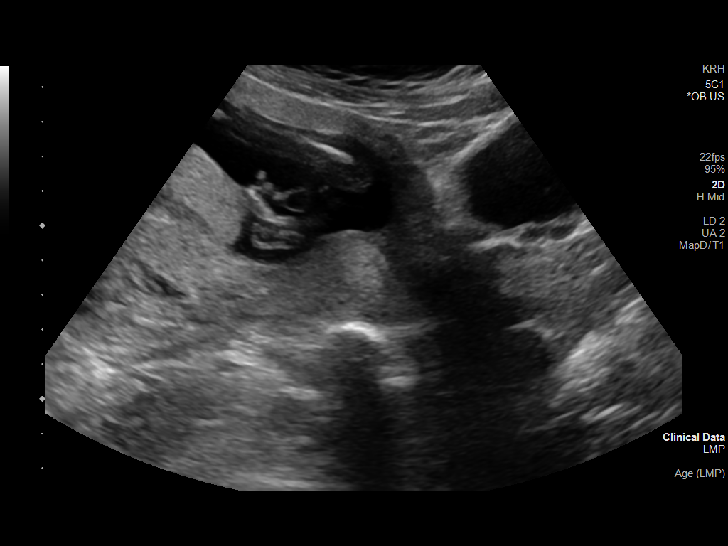
[im 16/34]
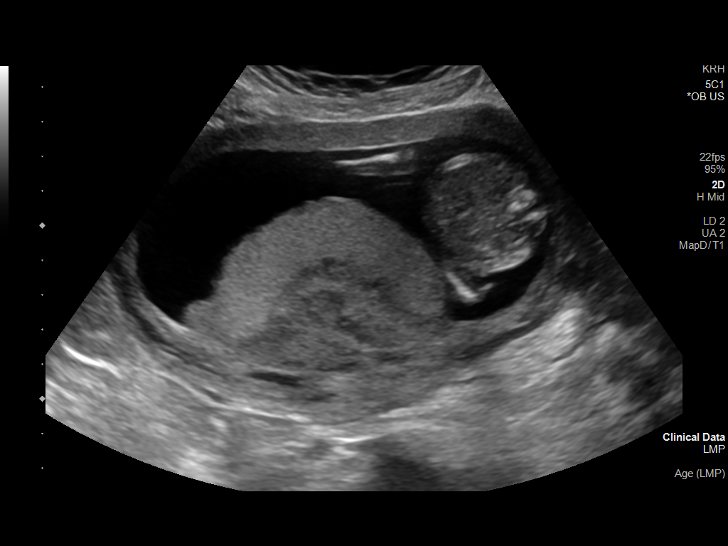
[im 19/34]
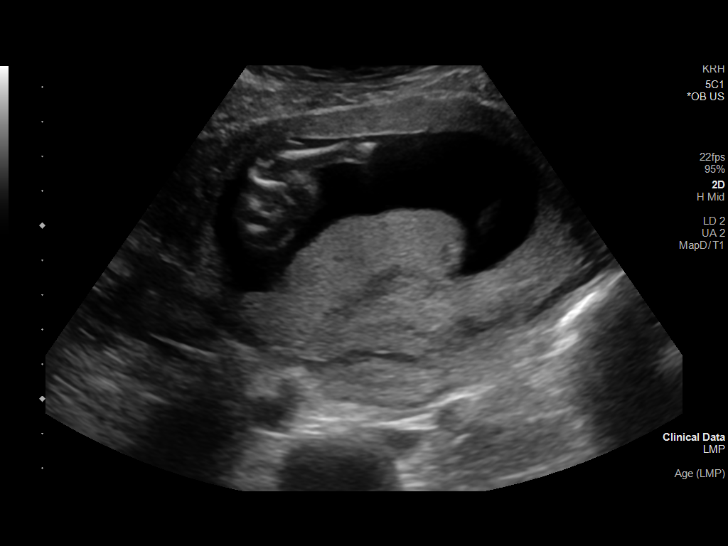
[im 21/34]
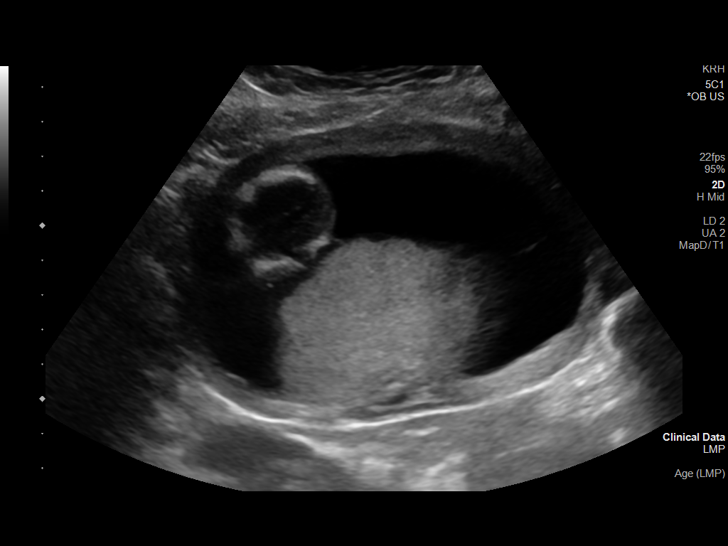
[im 24/34]
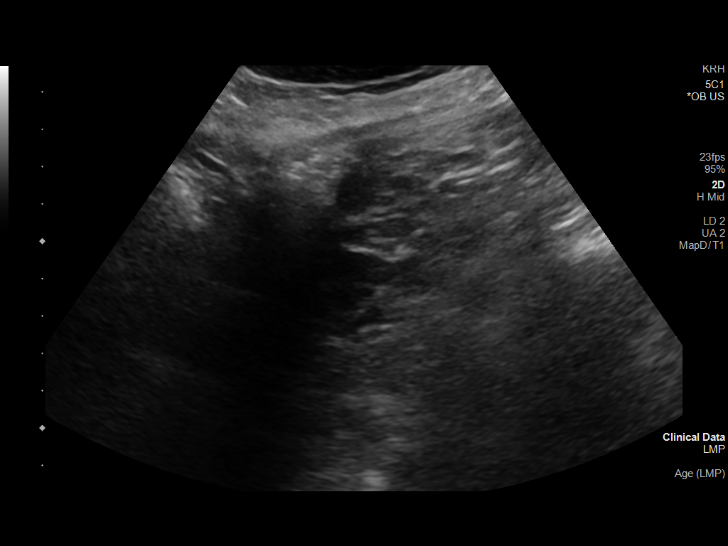
[im 26/34]
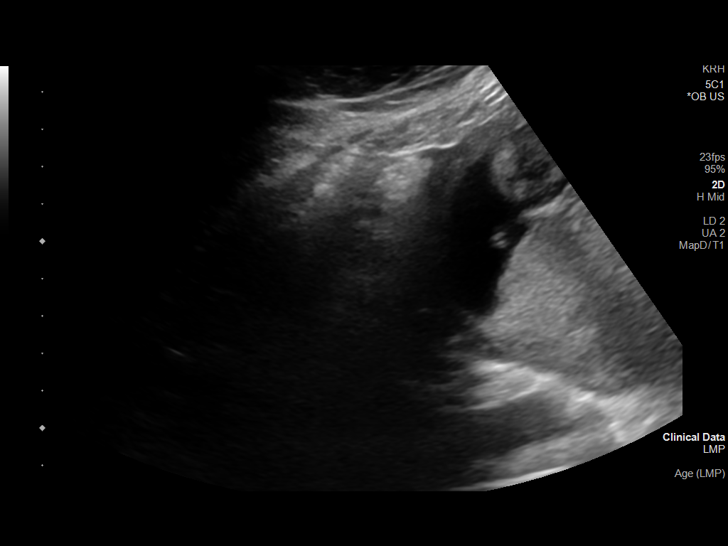
[im 29/34]
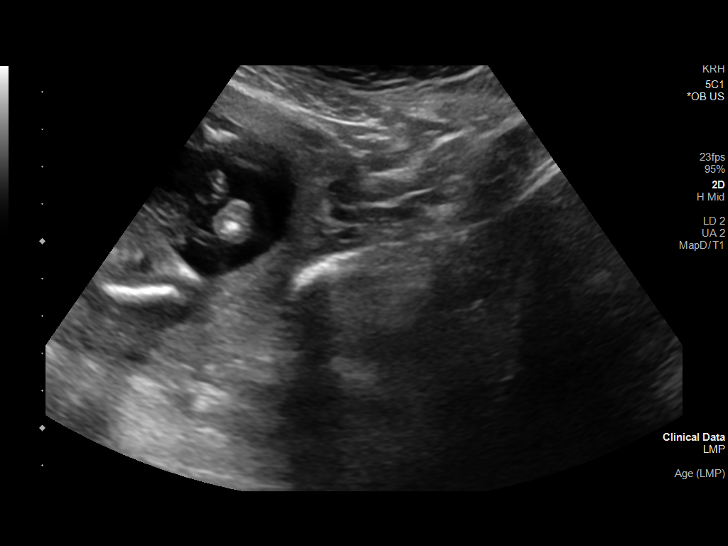
[im 31/34]
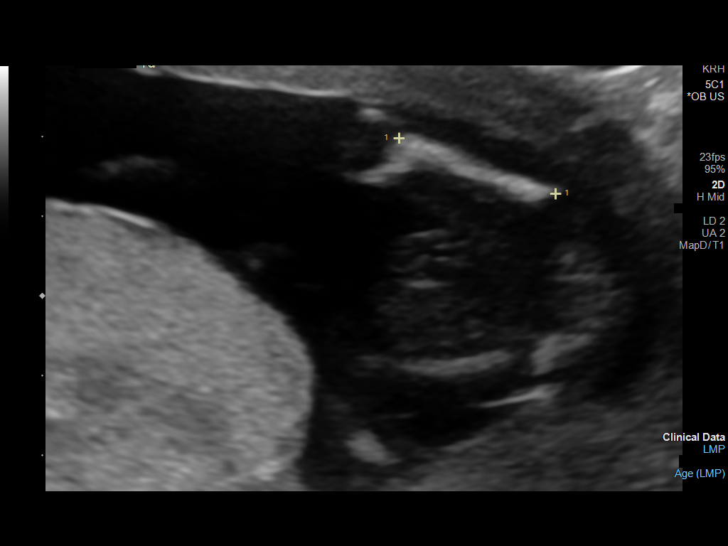
[im 34/34]
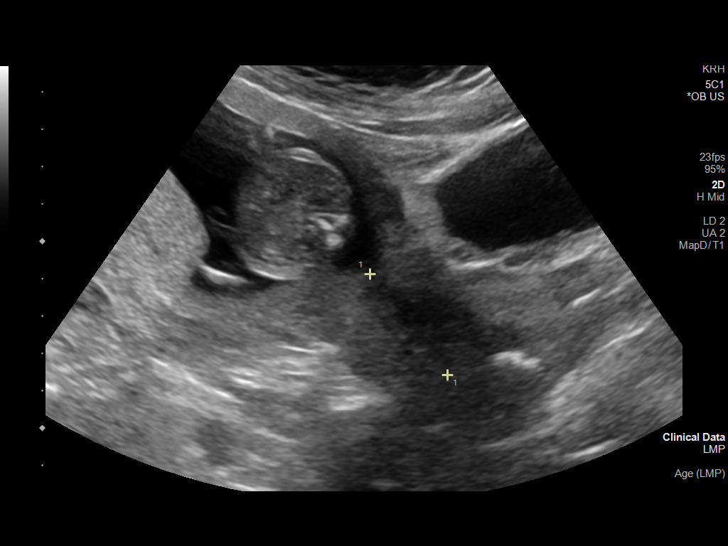

[14 of 28 positions shown; findings below may reference images not displayed]

FINDINGS: Number of Fetuses: 1

Heart Rate:  152 bpm

Movement: Yes

Presentation: Breech

Placental Location: Posterior

Previa: No

Amniotic Fluid (Subjective):  Within normal limits.

BPD: 2.1 cm 16 w  2 d

MATERNAL FINDINGS:

Cervix:  Appears closed.  The cervical length is at least 3.4 cm.

Uterus/Adnexae: The ovaries were not visualized on this study.
IMPRESSION: Single live IUP at 16 weeks and 2 days.

This exam is performed on an emergent basis and does not
comprehensively evaluate fetal size, dating, or anatomy; follow-up
complete OB US should be considered if further fetal assessment is
warranted.
